# Patient Record
Sex: Male | Born: 1975 | Race: White | Hispanic: No | Marital: Single | Smoking: Never smoker
Health system: Southern US, Community
[De-identification: ages and names within clinical notes are randomized; demographics above are authoritative.]

---

## 2010-12-19 ENCOUNTER — Encounter: Payer: Self-pay | Admitting: *Deleted

## 2010-12-19 ENCOUNTER — Emergency Department (HOSPITAL_COMMUNITY)
Admission: EM | Admit: 2010-12-19 | Discharge: 2010-12-19 | Disposition: A | Discharge status: Home / Self Care | Payer: BC Managed Care – PPO | Attending: Emergency Medicine | Admitting: Emergency Medicine

## 2010-12-19 ENCOUNTER — Emergency Department (HOSPITAL_COMMUNITY): Payer: BC Managed Care – PPO

## 2010-12-19 DIAGNOSIS — G43909 Migraine, unspecified, not intractable, without status migrainosus: Secondary | ICD-10-CM | POA: Insufficient documentation

## 2010-12-19 MED ORDER — BUTALBITAL-APAP-CAFFEINE 50-325-40 MG PO TABS: 1.0000 | ORAL_TABLET | Freq: Four times a day (QID) | ORAL | Status: AC | PRN

## 2010-12-19 MED ORDER — BUTALBITAL-APAP-CAFFEINE 50-325-40 MG PO TABS
2.0000 | ORAL_TABLET | Freq: Once | ORAL | Status: AC
  Administered 2010-12-19: 2 via ORAL
  Filled 2010-12-19: qty 2

## 2010-12-19 NOTE — ED Provider Notes (Signed)
Medical screening examination/treatment/procedure(s) were performed by non-physician practitioner and as supervising physician I was immediately available for consultation/collaboration.  Flint Melter, MD 12/19/10 1430

## 2010-12-19 NOTE — ED Notes (Signed)
Migraine headache that started yesterday with nausea, light/sound disturbances, spotty vision, and left hand weakness.

## 2010-12-19 NOTE — ED Provider Notes (Signed)
History     CSN: [id number] Arrival date & time: 12/19/2010  7:38 AM  Chief Complaint  Patient presents with  . Migraine   Patient is a 35 y.o. male presenting with migraine. The history is provided by the patient.  Migraine This is a recurrent (Last headache prior to this one was 1.5 years ago.) problem. The current episode started yesterday. The problem occurs rarely (He describes classic,  but rare migraine type symptoms,  starting when a child.  Developed scotoma yesterday am,  followed by headache and numbness in his left hand.  All symptoms are resolved,  except for persist bilateral forehead "nagging" pain.). The problem has been gradually improving. Associated symptoms include nausea and numbness. Pertinent negatives include no abdominal pain, arthralgias, chest pain, congestion, fever, headaches, joint swelling, neck pain, rash, sore throat, swollen glands, vomiting or weakness. Associated symptoms comments: Photophobia.  . Exacerbated by: light and sound. He has tried acetaminophen and NSAIDs for the symptoms. The treatment provided moderate relief.    Past Medical History  Diagnosis Date  . Migraine     History reviewed. No pertinent past surgical history.  No family history on file.  History  Substance Use Topics  . Smoking status: Never Smoker   . Smokeless tobacco: Not on file  . Alcohol Use: Yes     occasional      Review of Systems  Constitutional: Negative for fever.  HENT: Negative for hearing loss, ear pain, congestion, sore throat and neck pain.   Eyes: Positive for photophobia and visual disturbance.  Respiratory: Negative for chest tightness and shortness of breath.   Cardiovascular: Negative for chest pain and palpitations.  Gastrointestinal: Positive for nausea. Negative for vomiting and abdominal pain.  Genitourinary: Negative.   Musculoskeletal: Negative for joint swelling, arthralgias and gait problem.  Skin: Negative.  Negative for rash and wound.    Neurological: Positive for numbness. Negative for dizziness, weakness, light-headedness and headaches.  Hematological: Negative.   Psychiatric/Behavioral: Negative.     Physical Exam  BP 135/92  Pulse 71  Temp(Src) 97.7 F (36.5 C) (Oral)  Resp 16  Ht 6\' 1"  (1.854 m)  Wt 180 lb (81.647 kg)  BMI 23.75 kg/m2  SpO2 99%  Physical Exam  Vitals reviewed. Constitutional: He is oriented to person, place, and time. He appears well-developed and well-nourished.  HENT:  Head: Normocephalic and atraumatic.  Eyes: Conjunctivae and EOM are normal. Pupils are equal, round, and reactive to light.  Neck: Normal range of motion. Neck supple.  Cardiovascular: Normal rate, regular rhythm, normal heart sounds and intact distal pulses.   Pulmonary/Chest: Effort normal and breath sounds normal. He has no wheezes.  Abdominal: Soft.  Musculoskeletal: Normal range of motion.  Lymphadenopathy:    He has no cervical adenopathy.  Neurological: He is alert and oriented to person, place, and time. He has normal strength and normal reflexes. He displays normal reflexes. No cranial nerve deficit or sensory deficit. He exhibits normal muscle tone. He displays a negative Romberg sign. Coordination normal. GCS eye subscore is 4. GCS verbal subscore is 5. GCS motor subscore is 6.  Reflex Scores:      Bicep reflexes are 2+ on the right side and 2+ on the left side.      Patellar reflexes are 2+ on the right side and 2+ on the left side.      Normal heel to shin test.    Skin: Skin is warm and dry.  Psychiatric: He has a  normal mood and affect.    ED Course  Procedures  MDM Classic migraine presentation.  Negative CT head and normal exam.  No results found for this or any previous visit. Ct Head Wo Contrast  12/19/2010  *RADIOLOGY REPORT*  Clinical Data: Migraine headache for 2 days, severe  CT HEAD WITHOUT CONTRAST  Technique:  Contiguous axial images were obtained from the base of the skull through the  vertex without contrast.  Comparison: None  Findings: Slight asymmetry of lateral ventricles, left larger than right, within limits of normal variation. No midline shift or mass effect. Focal lucency at inferior left basal ganglia, likely a prominent perivascular space. No intracranial hemorrhage, mass lesion or evidence of acute infarction. No extra-axial fluid collections. Visualized paranasal sinuses and mastoid air cells clear. No acute osseous findings.  IMPRESSION: No acute intracranial abnormalities.  Original Report Authenticated By: Lollie Marrow, M.D.          Candis Musa, PA 12/19/10 1010

## 2010-12-19 NOTE — ED Notes (Signed)
Pt reports was at work yesterday and head started hurting.  Says was seeing spots and left hand was numb.  Also reports nausea.  Today head still hurts but denies other symptoms.  Pt alert and oriented.  Says feels like his usual migraines.

## 2012-04-27 IMAGING — CT CT HEAD W/O CM
1 series · 16 of 30 positions shown, 20 images · non-contrast
Comparison: None

CLINICAL DATA: Migraine headache for 2 days, severe

CT HEAD WITHOUT CONTRAST
TECHNIQUE: Contiguous axial images were obtained from the base of
the skull through the vertex without contrast.

[Series 2: headseq 4.8 h37s · axial · 0.46mm/px · z∈[+93,+228]mm · 16 of 30 slices shown, 20 images]
[im 2/30  brain]
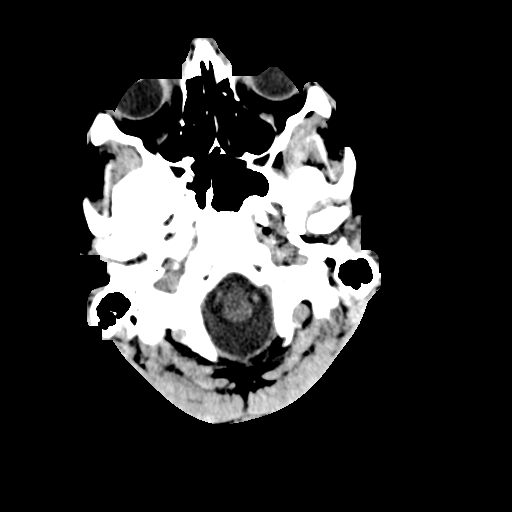
[im 2/30  bone]
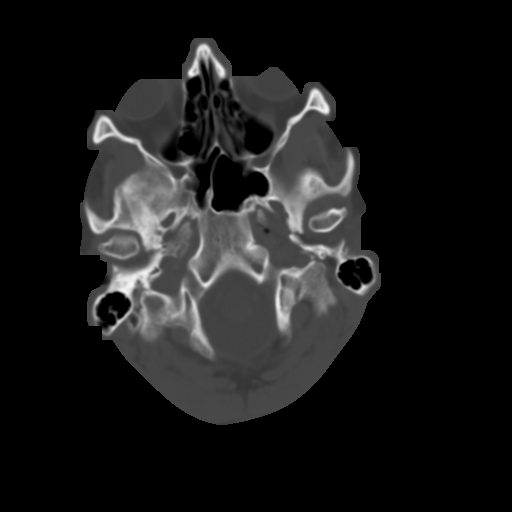
[im 4/30  brain]
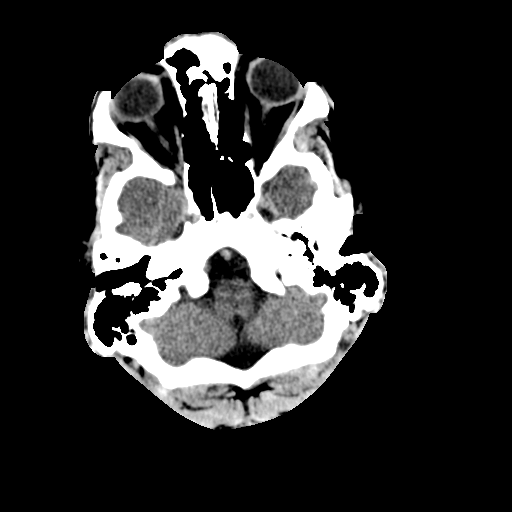
[im 6/30  brain]
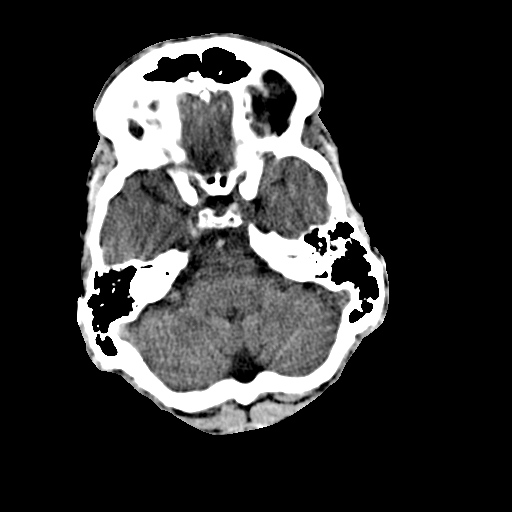
[im 8/30  brain]
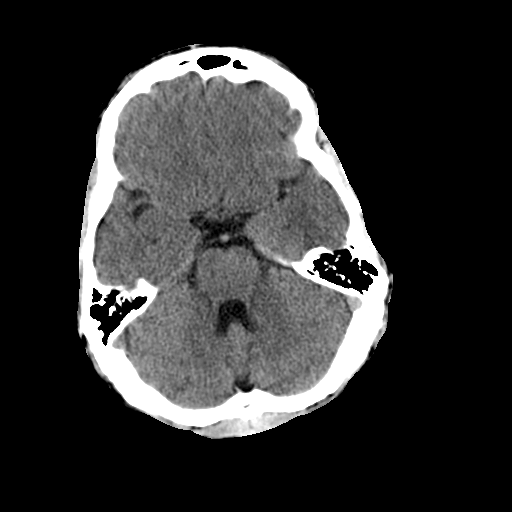
[im 9/30  brain]
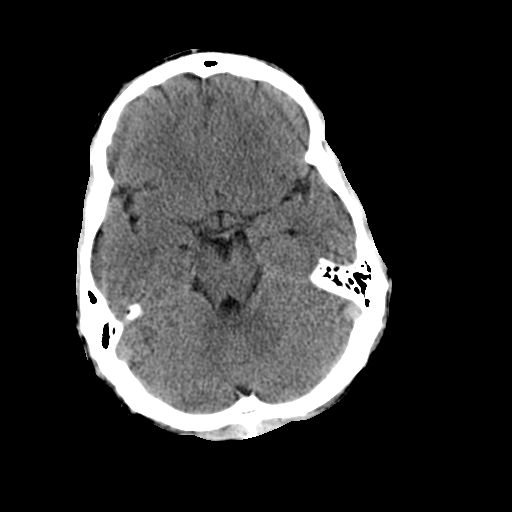
[im 9/30  bone]
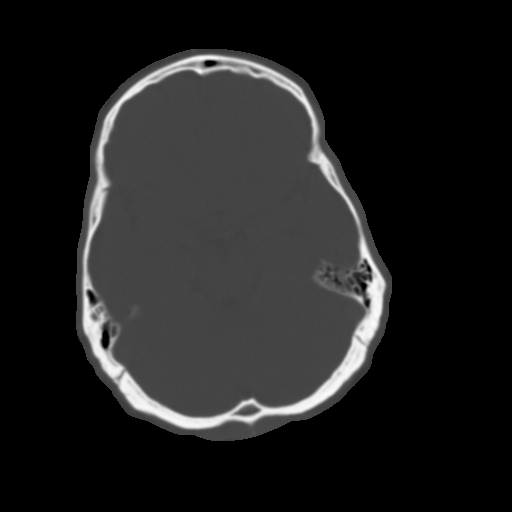
[im 11/30  brain]
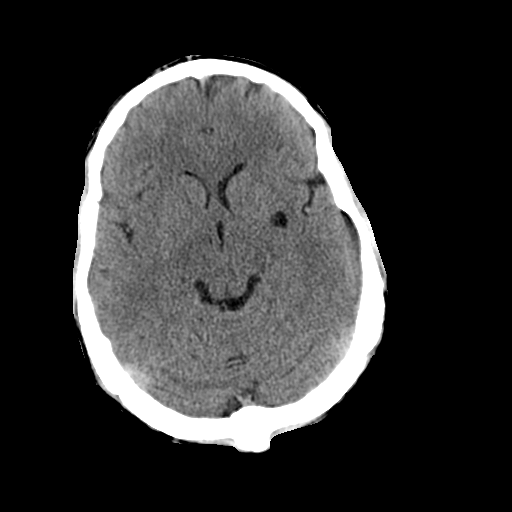
[im 13/30  brain]
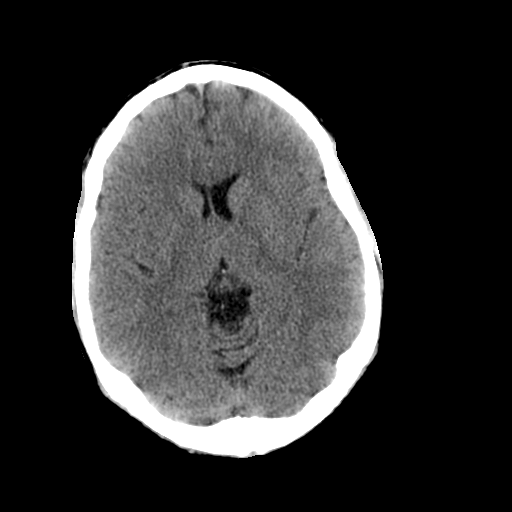
[im 15/30  brain]
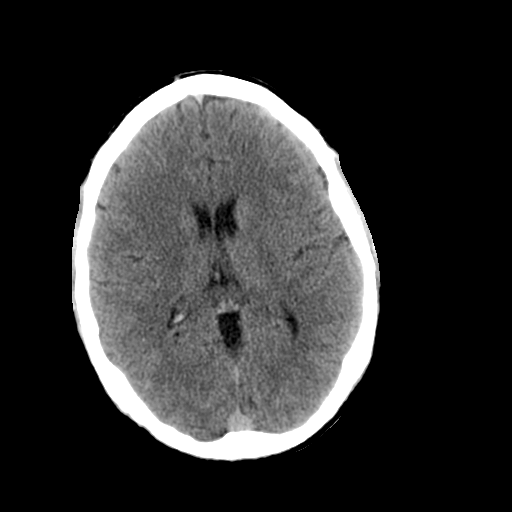
[im 16/30  brain]
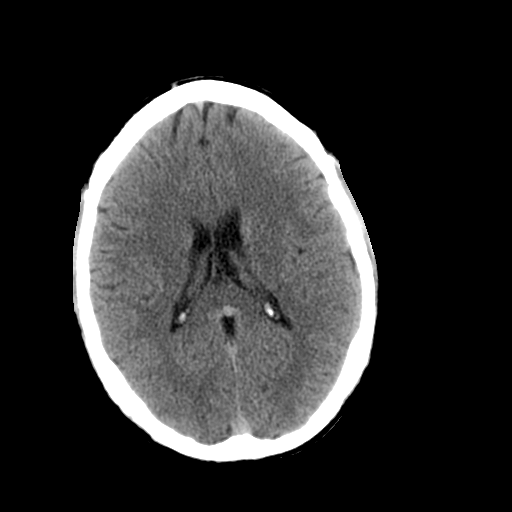
[im 16/30  bone]
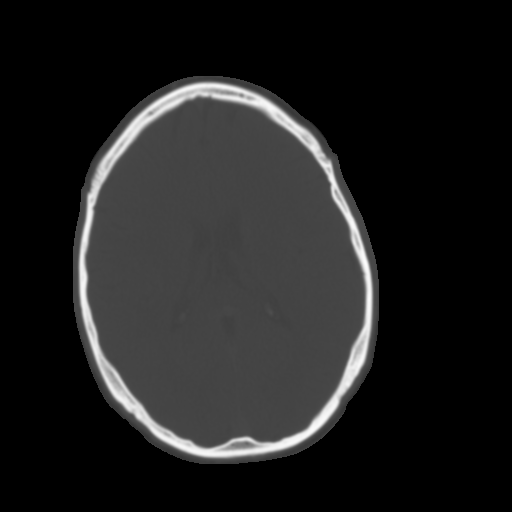
[im 18/30  brain]
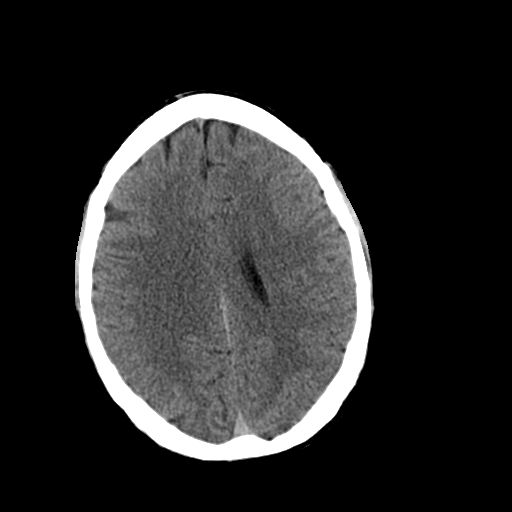
[im 20/30  brain]
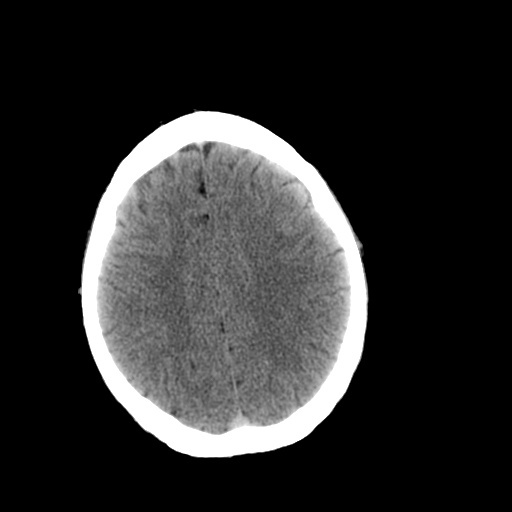
[im 22/30  brain]
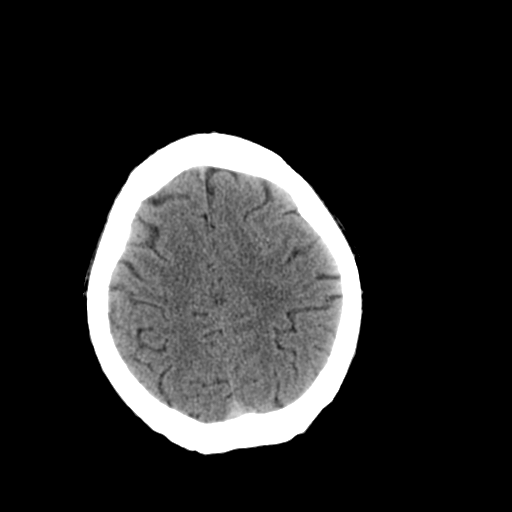
[im 23/30  brain]
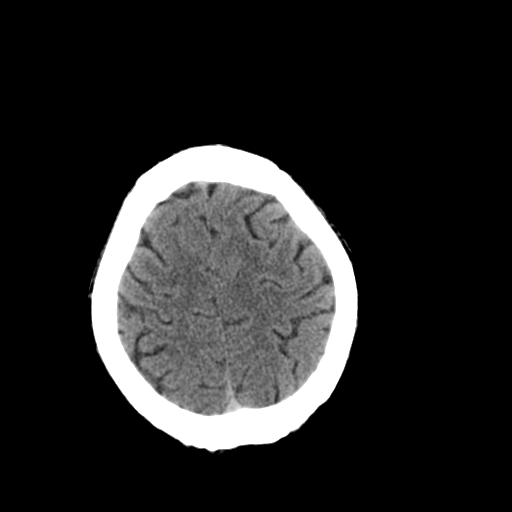
[im 23/30  bone]
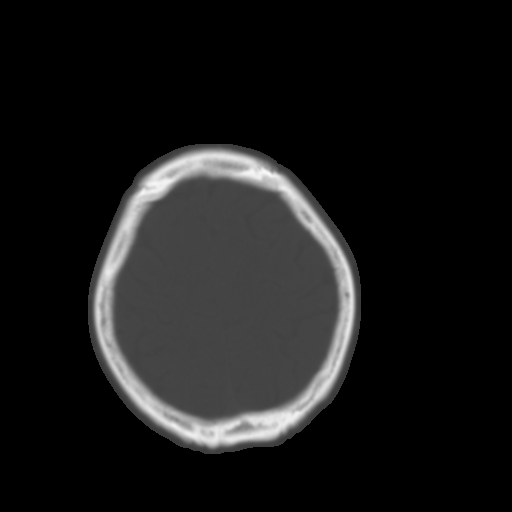
[im 25/30  brain]
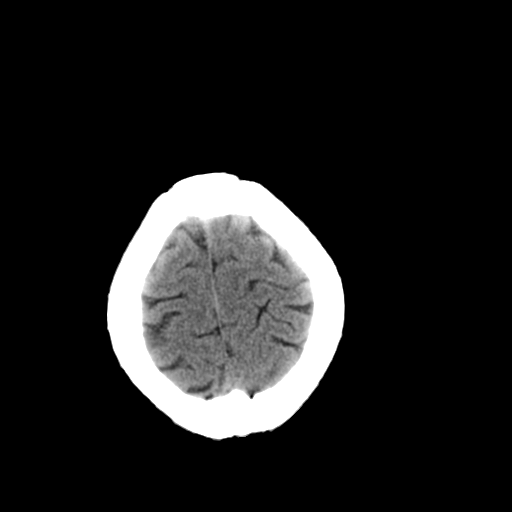
[im 27/30  brain]
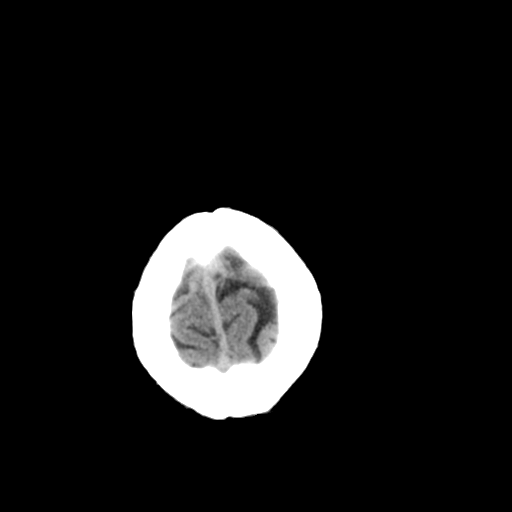
[im 29/30  brain]
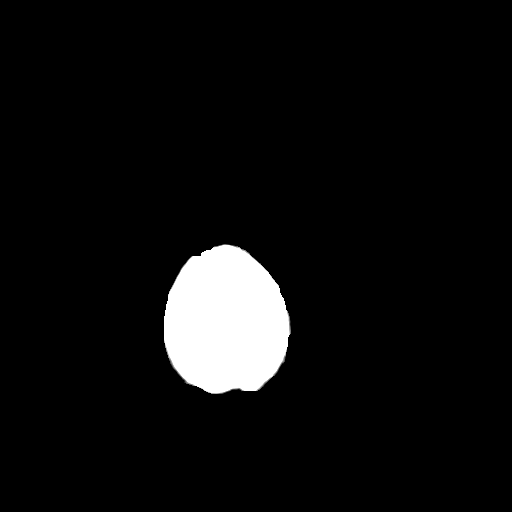

[16 of 30 positions shown; findings below may reference images not displayed]

FINDINGS: Slight asymmetry of lateral ventricles, left larger than right,
within limits of normal variation.
No midline shift or mass effect.
Focal lucency at inferior left basal ganglia, likely a prominent
perivascular space.
No intracranial hemorrhage, mass lesion or evidence of acute
infarction.
No extra-axial fluid collections.
Visualized paranasal sinuses and mastoid air cells clear.
No acute osseous findings.
IMPRESSION: No acute intracranial abnormalities.

## 2018-03-29 ENCOUNTER — Emergency Department (HOSPITAL_COMMUNITY)
Admission: EM | Admit: 2018-03-29 | Discharge: 2018-03-29 | Disposition: A | Discharge status: Home / Self Care | Payer: BLUE CROSS/BLUE SHIELD | Attending: Emergency Medicine | Admitting: Emergency Medicine

## 2018-03-29 ENCOUNTER — Other Ambulatory Visit: Payer: Self-pay

## 2018-03-29 ENCOUNTER — Encounter (HOSPITAL_COMMUNITY): Payer: Self-pay | Admitting: Emergency Medicine

## 2018-03-29 DIAGNOSIS — J029 Acute pharyngitis, unspecified: Secondary | ICD-10-CM

## 2018-03-29 DIAGNOSIS — J0100 Acute maxillary sinusitis, unspecified: Secondary | ICD-10-CM | POA: Insufficient documentation

## 2018-03-29 MED ORDER — AMOXICILLIN 500 MG PO CAPS: 500.0000 mg | ORAL_CAPSULE | Freq: Three times a day (TID) | ORAL | 0 refills | Status: AC

## 2018-03-29 NOTE — Discharge Instructions (Signed)
Return if any problems.

## 2018-03-29 NOTE — ED Provider Notes (Signed)
[facility] EMERGENCY DEPARTMENT Provider Note   CSN: [id number] Arrival date & time: 03/29/18  4098     History   Chief Complaint Chief Complaint  Patient presents with  . Sore Throat    HPI Mario Brown is a 42 y.o. male.  The history is provided by the patient. No language interpreter was used.  Sore Throat  This is a new problem. The current episode started more than 1 week ago. The problem occurs constantly. The problem has been gradually worsening. Nothing aggravates the symptoms. Nothing relieves the symptoms. He has tried nothing for the symptoms. The treatment provided no relief.   Pt complains of a sore throat, thick drainage from sinuses. Congested.  Past Medical History:  Diagnosis Date  . Migraine     There are no active problems to display for this patient.   History reviewed. No pertinent surgical history.      Home Medications    Prior to Admission medications   Medication Sig Start Date End Date Taking? Authorizing Provider  amoxicillin (AMOXIL) 500 MG capsule Take 1 capsule (500 mg total) by mouth 3 (three) times daily. 03/29/18   Elson Areas, PA-C    Family History No family history on file.  Social History Social History   Tobacco Use  . Smoking status: Never Smoker  Substance Use Topics  . Alcohol use: Yes    Comment: occasional  . Drug use: No     Allergies   Patient has no known allergies.   Review of Systems Review of Systems  All other systems reviewed and are negative.    Physical Exam Updated Vital Signs BP (!) 142/95 (BP Location: Right Arm)   Pulse 96   Temp 98 F (36.7 C) (Oral)   Resp 18   Wt 81.6 kg   SpO2 97%   BMI 23.73 kg/m   Physical Exam  Constitutional: He appears well-developed and well-nourished.  HENT:  Head: Normocephalic and atraumatic.  Right Ear: Hearing normal.  Left Ear: Hearing normal.  Mouth/Throat: Mucous membranes are normal. Posterior oropharyngeal edema and posterior  oropharyngeal erythema present. Tonsils are 0 on the left.  Eyes: Pupils are equal, round, and reactive to light. Conjunctivae are normal.  Neck: Neck supple.  Cardiovascular: Normal rate and regular rhythm.  No murmur heard. Pulmonary/Chest: Effort normal and breath sounds normal. No respiratory distress.  Abdominal: Soft. There is no tenderness.  Musculoskeletal: He exhibits no edema.  Neurological: He is alert.  Skin: Skin is warm and dry.  Psychiatric: He has a normal mood and affect.  Nursing note and vitals reviewed.    ED Treatments / Results  Labs (all labs ordered are listed, but only abnormal results are displayed) Labs Reviewed - No data to display  EKG None  Radiology No results found.  Procedures Procedures (including critical care time)  Medications Ordered in ED Medications - No data to display   Initial Impression / Assessment and Plan / ED Course  I have reviewed the triage vital signs and the nursing notes.  Pertinent labs & imaging results that were available during my care of the patient were reviewed by me and considered in my medical decision making (see chart for details).     MDM  Possible sinus infection.    Final Clinical Impressions(s) / ED Diagnoses   Final diagnoses:  Acute maxillary sinusitis, recurrence not specified  Sore throat    ED Discharge Orders         Ordered  amoxicillin (AMOXIL) 500 MG capsule  3 times daily     03/29/18 0848        An After Visit Summary was printed and given to the patient.    Elson AreasSofia, Meda Dudzinski K, [place]PA-C 03/29/18 [id number]    Bethann BerkshireZammit, Joseph, MD 03/29/18 [phone]

## 2018-03-29 NOTE — ED Triage Notes (Signed)
Pt C/O sore throat that started Friday. Pt denies cough.
